# Patient Record
Sex: Female | Born: 2000 | Race: White | Hispanic: No | Marital: Single | Smoking: Former smoker
Health system: Southern US, Community
[De-identification: ages and names within clinical notes are randomized; demographics above are authoritative.]

## PROBLEM LIST (undated history)

## (undated) HISTORY — PX: NO PAST SURGERIES: SHX2092

---
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
## 2021-01-18 ENCOUNTER — Ambulatory Visit: Payer: Self-pay | Admitting: Orthopaedic Surgery

## 2021-07-05 ENCOUNTER — Encounter: Payer: Self-pay | Admitting: Emergency Medicine

## 2021-07-05 ENCOUNTER — Emergency Department
Admission: EM | Admit: 2021-07-05 | Discharge: 2021-07-05 | Discharge status: Absent without leave | Payer: 59 | Source: Home / Self Care

## 2021-07-05 ENCOUNTER — Other Ambulatory Visit: Payer: Self-pay

## 2021-07-05 ENCOUNTER — Emergency Department
Admission: EM | Admit: 2021-07-05 | Discharge: 2021-07-05 | Disposition: A | Discharge status: Other Acute Inpatient Hospital | Payer: 59 | Source: Home / Self Care

## 2021-07-05 DIAGNOSIS — T50905A Adverse effect of unspecified drugs, medicaments and biological substances, initial encounter: Secondary | ICD-10-CM

## 2021-07-05 NOTE — ED Triage Notes (Signed)
Patient states that she got vaccinated for COVID on Tuesday (J&J), started having weakness on right side of body.  Arm and leg feels like jelly and SOB.  Patient received the injection from [facility].

## 2021-07-05 NOTE — Discharge Instructions (Addendum)
You are having more symptoms than we can evaluate in the urgent care.  Go to the ER

## 2021-07-16 ENCOUNTER — Ambulatory Visit (INDEPENDENT_AMBULATORY_CARE_PROVIDER_SITE_OTHER): Payer: 59 | Admitting: Physician Assistant

## 2021-07-16 ENCOUNTER — Encounter: Payer: Self-pay | Admitting: Physician Assistant

## 2021-07-16 ENCOUNTER — Other Ambulatory Visit: Payer: Self-pay

## 2021-07-16 VITALS — BP 125/81 | HR 107 | Ht 62.0 in | Wt 170.0 lb

## 2021-07-16 DIAGNOSIS — F419 Anxiety disorder, unspecified: Secondary | ICD-10-CM

## 2021-07-16 DIAGNOSIS — R7301 Impaired fasting glucose: Secondary | ICD-10-CM | POA: Diagnosis not present

## 2021-07-16 DIAGNOSIS — R202 Paresthesia of skin: Secondary | ICD-10-CM

## 2021-07-16 DIAGNOSIS — T50Z95A Adverse effect of other vaccines and biological substances, initial encounter: Secondary | ICD-10-CM

## 2021-07-16 DIAGNOSIS — R200 Anesthesia of skin: Secondary | ICD-10-CM | POA: Diagnosis not present

## 2021-07-16 DIAGNOSIS — Z720 Tobacco use: Secondary | ICD-10-CM

## 2021-07-16 DIAGNOSIS — R000 Tachycardia, unspecified: Secondary | ICD-10-CM | POA: Insufficient documentation

## 2021-07-16 DIAGNOSIS — Z1329 Encounter for screening for other suspected endocrine disorder: Secondary | ICD-10-CM

## 2021-07-16 NOTE — Progress Notes (Signed)
New Patient Office Visit  Subjective:  Patient ID: Jamie Krueger, female    DOB: 01/12/2001  Age: 20 y.o. MRN: [id number]  CC:  Chief Complaint  Patient presents with   Establish Care    HPI Jamie Krueger presents to establish care and discuss recent covid vaccine reaction. Pt received J and J vaccine 07/04/2021. That night she ran a fever and then next morning work up with right arm and leg tingling, weakness, heaviness. She went to [facility] and sent to ED to rule out blood clot. CBC, CMP look good. Fasting glucose was elevated. She is feeling better today. Her strength has come back and just has some tingling in right upper arm.   History reviewed. No pertinent past medical history.  Past Surgical History:  Procedure Laterality Date   NO PAST SURGERIES      Family History  Problem Relation Age of Onset   Hypertension Father    Breast cancer Maternal Grandmother     Social History   Socioeconomic History   Marital status: Single    Spouse name: Not on file   Number of children: Not on file   Years of education: Not on file   Highest education level: Not on file  Occupational History   Not on file  Tobacco Use   Smoking status: Former    Types: Cigarettes   Smokeless tobacco: Current  Substance and Sexual Activity   Alcohol use: Yes    Comment: 5 drinks per week   Drug use: Yes    Types: Marijuana    Comment: rarely   Sexual activity: Yes    Partners: Male    Birth control/protection: Condom, Pill  Other Topics Concern   Not on file  Social History Narrative   Not on file   Social Determinants of Health   Financial Resource Strain: Not on file  Food Insecurity: Not on file  Transportation Needs: Not on file  Physical Activity: Not on file  Stress: Not on file  Social Connections: Not on file  Intimate Partner Violence: Not on file    ROS Review of Systems See HPI.  Objective:   Today's Vitals: BP 125/81   Pulse (!) 107   Ht 5' 2"  (1.575 m)   Wt 170  lb (77.1 kg)   LMP 06/20/2021   SpO2 98%   BMI 31.09 kg/m   Physical Exam Vitals reviewed.  Constitutional:      Appearance: Normal appearance. She is obese.  HENT:     Head: Normocephalic.     Right Ear: Tympanic membrane normal.     Left Ear: Tympanic membrane normal.     Nose: Nose normal.     Mouth/Throat:     Mouth: Mucous membranes are moist.  Neck:     Vascular: No carotid bruit.  Cardiovascular:     Rate and Rhythm: Normal rate and regular rhythm.     Pulses: Normal pulses.     Heart sounds: Normal heart sounds.  Pulmonary:     Effort: Pulmonary effort is normal.  Musculoskeletal:        General: No swelling, tenderness, deformity or signs of injury.     Right lower leg: No edema.     Left lower leg: No edema.     Comments: 5/5 strength of upper and lower extremity.   Neurological:     General: No focal deficit present.     Mental Status: She is alert and oriented to person, place, and time.  Psychiatric:     Comments: anxious   .Marland Kitchen Depression screen [facility] 2/9 07/16/2021  Decreased Interest 0  Down, Depressed, Hopeless 1  PHQ - 2 Score 1  Altered sleeping 0  Tired, decreased energy 1  Change in appetite 2  Feeling bad or failure about yourself  1  Trouble concentrating 0  Moving slowly or fidgety/restless 0  Suicidal thoughts 0  PHQ-9 Score 5  Difficult doing work/chores Not difficult at all   .Marland Kitchen GAD 7 : Generalized Anxiety Score 07/16/2021  Nervous, Anxious, on Edge 3  Control/stop worrying 3  Worry too much - different things 3  Trouble relaxing 2  Restless 0  Easily annoyed or irritable 2  Afraid - awful might happen 2  Total GAD 7 Score 15  Anxiety Difficulty Somewhat difficult     Assessment & Plan:  Marland KitchenMarland KitchenAppollonia was seen today for establish care.  Diagnoses and all orders for this visit:  Adverse effect of vaccine, initial encounter -     Sed Rate (ESR)  Vapes nicotine containing substance  Elevated fasting glucose -     Hemoglobin  A1c  Numbness and tingling of right arm -     Sed Rate (ESR) -     TSH  Thyroid disorder screen -     TSH  Tachycardia -     TSH  Anxiety  Pt seems to be improving from vaccine reaction. Concern with elevated serum glucose in hospital due to patient reporting she was fasting.  Will recheck A1C, TSH, ESR today.  Discussed increased HR and likely due to vaping/nicotine and/or anxiety. Pt does not want to work on cessation right now with medication.  Follow up if would like to discuss anxiety again. GAD-7 was 15. Perhaps anxiety medication could help you transition off vaping. If tingling not going a way could do some EMG to look for nerve damage. Consider gabapentin or SSNRI for ongoing tingling.   Reminder patient needs pap smear.    Follow-up: Return in about 1 year (around 07/16/2022).   Iran Planas, PA-C

## 2021-07-17 LAB — HEMOGLOBIN A1C
Hgb A1c MFr Bld: 5 % of total Hgb (ref <5.7)
Mean Plasma Glucose: 97 mg/dL
eAG (mmol/L): 5.4 mmol/L

## 2021-07-17 LAB — SEDIMENTATION RATE: Sed Rate: 2 mm/h (ref 0–20)

## 2021-07-17 LAB — TSH: TSH: 0.69 mIU/L

## 2021-07-17 NOTE — Progress Notes (Signed)
Jamie Krueger,   A1C is great at 5.0 no signs of diabetes. Mean sugar of 97. TSH in normal range. Normal inflammation rate.

## 2021-11-23 ENCOUNTER — Encounter: Payer: Self-pay | Admitting: Physician Assistant

## 2021-11-23 ENCOUNTER — Telehealth (INDEPENDENT_AMBULATORY_CARE_PROVIDER_SITE_OTHER): Payer: 59 | Admitting: Physician Assistant

## 2021-11-23 DIAGNOSIS — R4589 Other symptoms and signs involving emotional state: Secondary | ICD-10-CM

## 2021-11-23 DIAGNOSIS — F951 Chronic motor or vocal tic disorder: Secondary | ICD-10-CM

## 2021-11-23 DIAGNOSIS — F411 Generalized anxiety disorder: Secondary | ICD-10-CM

## 2021-11-23 DIAGNOSIS — R454 Irritability and anger: Secondary | ICD-10-CM

## 2021-11-23 DIAGNOSIS — Z3041 Encounter for surveillance of contraceptive pills: Secondary | ICD-10-CM

## 2021-11-23 DIAGNOSIS — M7918 Myalgia, other site: Secondary | ICD-10-CM | POA: Insufficient documentation

## 2021-11-23 MED ORDER — NECON 0.5/35 (28) 0.5-35 MG-MCG PO TABS: ORAL_TABLET | ORAL | 3 refills | Status: AC

## 2021-11-23 MED ORDER — DULOXETINE HCL 30 MG PO CPEP: 30.0000 mg | ORAL_CAPSULE | Freq: Every day | ORAL | 1 refills | Status: DC

## 2021-11-23 NOTE — Progress Notes (Signed)
..Virtual Visit via Video Note  I connected with Jamie Krueger on 11/23/21 at  8:10 AM EST by a video enabled telemedicine application and verified that I am speaking with the correct person using two identifiers.  Location: Patient: home Provider: clinic  .Marland KitchenParticipating in visit:  Patient: Jamie Krueger Provider: Tandy Gaw PA-C   I discussed the limitations of evaluation and management by telemedicine and the availability of in person appointments. The patient expressed understanding and agreed to proceed.  History of Present Illness: Pt is a 20 yo female who presents to the clinic to discuss mood and pain.   Pt went off prozac about 1 year ago and since then her mood has continued to go down hill. She has gained 20lbs and just does not feel good. She is irritable. She is anxious all the time. She worries about everything. She feels achy all over her body. Her mother has fibromyalgia. She would like to go on medication. No SI/HC.   She did stop vaping 2 months.   Pt does need OCP refilled. Never had pap.   .. 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   Numbness and tingling of right arm 07/16/2021   GAD (generalized anxiety disorder) 11/23/2021   Chronic motor tic disorder 11/23/2021   Depressed mood 11/23/2021   Myofascial pain 11/23/2021   Irritable 11/23/2021   Resolved Ambulatory Problems    Diagnosis Date Noted   No Resolved Ambulatory Problems   No Additional Past Medical History      Observations/Objective: No acute distress Normal mood and appearance .Marland Kitchen Depression screen [facility] 2/9 11/23/2021 07/16/2021  Decreased Interest 1 0  Down, Depressed, Hopeless 0 1  PHQ - 2 Score 1 1  Altered sleeping 0 0  Tired, decreased  energy 1 1  Change in appetite 2 2  Feeling bad or failure about yourself  1 1  Trouble concentrating 1 0  Moving slowly or fidgety/restless 0 0  Suicidal thoughts 0 0  PHQ-9 Score 6 5  Difficult doing work/chores Somewhat difficult Not difficult at all   .Marland Kitchen GAD 7 : Generalized Anxiety Score 11/23/2021 07/16/2021  Nervous, Anxious, on Edge 3 3  Control/stop worrying 3 3  Worry too much - different things 3 3  Trouble relaxing 3 2  Restless 2 0  Easily annoyed or irritable 3 2  Afraid - awful might happen 3 2  Total GAD 7 Score 20 15  Anxiety Difficulty Extremely difficult Somewhat difficult     Assessment and Plan: Marland KitchenMarland KitchenIdalys was seen today for mental health problem.  Diagnoses and all orders for this visit:  GAD (generalized anxiety disorder) -     DULoxetine (CYMBALTA) 30 MG capsule; Take 1 capsule (30 mg total) by mouth daily.  Chronic motor tic disorder  Depressed mood -     DULoxetine (CYMBALTA) 30 MG capsule; Take 1 capsule (30 mg total) by mouth daily.  Irritable -     DULoxetine (CYMBALTA) 30 MG capsule; Take 1 capsule (30 mg total) by mouth daily.  Myofascial pain -     DULoxetine (CYMBALTA) 30 MG capsule; Take 1 capsule (30 mg total) by mouth daily.  Encounter for surveillance of contraceptive pills -     norethindrone-ethinyl estradiol (NECON 0.5/35, 28,) 0.5-35 MG-MCG tablet;  TK 1 T PO QD  Congrats on stopping vaping  PHQ stable. GAD not to goal. Concern for fibromyalgia Start cymbalta to help with mood and pain.  Discussed side effects and how to take cymbalta. Follow up in 6-8 weeks.   Needs pap at 20.  Refilled OCP Discussed STD prevention   Follow Up Instructions:    I discussed the assessment and treatment plan with the patient. The patient was provided an opportunity to ask questions and all were answered. The patient agreed with the plan and demonstrated an understanding of the instructions.   The patient was advised to call back or seek an  in-person evaluation if the symptoms worsen or if the condition fails to improve as anticipated.    Tandy Gaw, PA-C

## 2021-12-26 ENCOUNTER — Encounter: Payer: Self-pay | Admitting: Physician Assistant

## 2021-12-26 ENCOUNTER — Telehealth: Payer: 59 | Admitting: Physician Assistant

## 2021-12-26 VITALS — Temp 98.1°F | Ht 62.0 in | Wt 168.0 lb

## 2021-12-26 DIAGNOSIS — F410 Panic disorder [episodic paroxysmal anxiety] without agoraphobia: Secondary | ICD-10-CM | POA: Diagnosis not present

## 2021-12-26 DIAGNOSIS — F411 Generalized anxiety disorder: Secondary | ICD-10-CM

## 2021-12-26 DIAGNOSIS — R4589 Other symptoms and signs involving emotional state: Secondary | ICD-10-CM

## 2021-12-26 DIAGNOSIS — M7918 Myalgia, other site: Secondary | ICD-10-CM | POA: Diagnosis not present

## 2021-12-26 MED ORDER — DULOXETINE HCL 60 MG PO CPEP: 60.0000 mg | ORAL_CAPSULE | Freq: Every day | ORAL | 1 refills | Status: DC

## 2021-12-26 MED ORDER — HYDROXYZINE HCL 10 MG PO TABS: 10.0000 mg | ORAL_TABLET | Freq: Three times a day (TID) | ORAL | 1 refills | Status: AC | PRN

## 2021-12-26 MED ORDER — HYDROXYZINE HCL 10 MG PO TABS
10.0000 mg | ORAL_TABLET | Freq: Three times a day (TID) | ORAL | 1 refills | Status: DC | PRN
Start: 2021-12-26 — End: 2021-12-26

## 2021-12-26 NOTE — Addendum Note (Signed)
Addended bySilvio Pate on: 12/26/2021 10:45 AM   Modules accepted: Orders

## 2021-12-26 NOTE — Progress Notes (Signed)
..Virtual Visit via Video Note  I connected with Jamie Krueger on 12/26/21 at  9:30 AM EST by a video enabled telemedicine application and verified that I am speaking with the correct person using two identifiers.  Location: Patient: home Provider: clinic  .Marland KitchenParticipating in visit:  Patient: Jamie Krueger Provider: Tandy Gaw PA-C   I discussed the limitations of evaluation and management by telemedicine and the availability of in person appointments. The patient expressed understanding and agreed to proceed.  History of Present Illness: Pt is a 21 yo female who needs refill on medications and to follow up on GAD, Depressed mood, myofascial pain.   She is doing much better. Her anxiety has improved overall but had a few little more severe panic attack where her chest hurt. Depressed mood is much better. Her overall pain is 60 percent better. No SI/HC. She is very happy with medication. Denies any side effects.   .. 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   Numbness and tingling of right arm 07/16/2021   GAD (generalized anxiety disorder) 11/23/2021   Chronic motor tic disorder 11/23/2021   Depressed mood 11/23/2021   Myofascial pain 11/23/2021   Irritable 11/23/2021   Panic attacks 12/26/2021   Resolved Ambulatory Problems    Diagnosis Date Noted   No Resolved Ambulatory Problems   No Additional Past Medical History    Observations/Objective: No acute distress Normal mood and appearance Normal breathing  .Marland Kitchen Today's Vitals   12/26/21 0927  Temp: 98.1 F (36.7 C)  Weight: 168 lb (76.2 kg)  Height: 5\' 2"  (1.575 m)   Body mass index is 30.73 kg/m.   .. Depression screen [facility] 2/9 12/26/2021 11/23/2021 07/16/2021  Decreased  Interest 0 1 0  Down, Depressed, Hopeless 0 0 1  PHQ - 2 Score 0 1 1  Altered sleeping 0 0 0  Tired, decreased energy 1 1 1   Change in appetite 0 2 2  Feeling bad or failure about yourself  0 1 1  Trouble concentrating 0 1 0  Moving slowly or fidgety/restless 0 0 0  Suicidal thoughts 0 0 0  PHQ-9 Score 1 6 5   Difficult doing work/chores Not difficult at all Somewhat difficult Not difficult at all   .07/18/2021 GAD 7 : Generalized Anxiety Score 12/26/2021 11/23/2021 07/16/2021  Nervous, Anxious, on Edge 1 3 3   Control/stop worrying 1 3 3   Worry too much - different things 1 3 3   Trouble relaxing 1 3 2   Restless 0 2 0  Easily annoyed or irritable 1 3 2   Afraid - awful might happen 1 3 2   Total GAD 7 Score 6 20 15   Anxiety Difficulty Somewhat difficult Extremely difficult Somewhat difficult     Assessment and Plan: 4/1/202301/01/2023Ayodele was seen today for medication management.  Diagnoses and all orders for this visit:  GAD (generalized anxiety disorder) -     DULoxetine (CYMBALTA) 60 MG capsule; Take 1 capsule (60 mg total) by mouth daily.  Depressed mood -     DULoxetine (CYMBALTA) 60 MG capsule; Take 1 capsule (60 mg total) by mouth daily.  Myofascial pain -     DULoxetine (CYMBALTA) 60 MG capsule; Take 1 capsule (60 mg total) by mouth daily.  Panic attacks -  hydrOXYzine (ATARAX) 10 MG tablet; Take 1 tablet (10 mg total) by mouth 3 (three) times daily as needed. For anxiety and panic attacks   PHQ and GAD numbers improved.  Pt still having some panic attacks Increased cymbalta to 60mg  and added hydroxyzine for as needed anxiety/panic attacks. Discussed importance of breathing during panic attacks.  Pain is much better.  Follow up in 6 months or as needed.    Follow Up Instructions:    I discussed the assessment and treatment plan with the patient. The patient was provided an opportunity to ask questions and all were answered. The patient agreed with the plan and demonstrated an  understanding of the instructions.   The patient was advised to call back or seek an in-person evaluation if the symptoms worsen or if the condition fails to improve as anticipated.    , PA-C

## 2021-12-26 NOTE — Progress Notes (Signed)
Pt would like to follow up cymbalta overall pain and anxiety  has gone  down pt needs rx fill

## 2022-01-02 ENCOUNTER — Encounter: Payer: Self-pay | Admitting: Physician Assistant

## 2022-01-02 DIAGNOSIS — R4589 Other symptoms and signs involving emotional state: Secondary | ICD-10-CM

## 2022-01-02 DIAGNOSIS — F411 Generalized anxiety disorder: Secondary | ICD-10-CM

## 2022-01-02 DIAGNOSIS — M7918 Myalgia, other site: Secondary | ICD-10-CM

## 2022-01-04 MED ORDER — DULOXETINE HCL 60 MG PO CPEP: 60.0000 mg | ORAL_CAPSULE | Freq: Every day | ORAL | 1 refills | Status: DC

## 2022-01-04 MED ORDER — DULOXETINE HCL 60 MG PO CPEP: 60.0000 mg | ORAL_CAPSULE | Freq: Every day | ORAL | 1 refills | Status: AC

## 2022-01-30 ENCOUNTER — Telehealth: Payer: 59 | Admitting: Physician Assistant

## 2022-01-30 ENCOUNTER — Encounter: Payer: Self-pay | Admitting: Physician Assistant

## 2022-01-30 ENCOUNTER — Other Ambulatory Visit: Payer: Self-pay

## 2022-01-30 ENCOUNTER — Other Ambulatory Visit: Payer: Self-pay | Admitting: Physician Assistant

## 2022-01-30 ENCOUNTER — Ambulatory Visit (INDEPENDENT_AMBULATORY_CARE_PROVIDER_SITE_OTHER): Payer: 59

## 2022-01-30 DIAGNOSIS — R1031 Right lower quadrant pain: Secondary | ICD-10-CM | POA: Diagnosis not present

## 2022-01-30 DIAGNOSIS — R102 Pelvic and perineal pain unspecified side: Secondary | ICD-10-CM

## 2022-01-30 DIAGNOSIS — R8290 Unspecified abnormal findings in urine: Secondary | ICD-10-CM | POA: Diagnosis not present

## 2022-01-30 MED ORDER — IOHEXOL 300 MG/ML  SOLN
100.0000 mL | Freq: Once | INTRAMUSCULAR | Status: AC | PRN
  Administered 2022-01-30: 100 mL via INTRAVENOUS

## 2022-01-30 NOTE — Progress Notes (Signed)
..Virtual Visit via Video Note ? ?I connected with Jamie Krueger on 01/30/22 at  7:50 AM EST by a video enabled telemedicine application and verified that I am speaking with the correct person using two identifiers. ? ?Location: ?Patient: home ?Provider: clinic ? ?Marland Kitchen.Participating in visit:  ?Patient: Jamie Krueger ?Provider: Tandy Gaw PA-C ?Provider in training: Colbert Coyer PA-S ?  ?I discussed the limitations of evaluation and management by telemedicine and the availability of in person appointments. The patient expressed understanding and agreed to proceed. ? ?History of Present Illness: ?Pt is a 21 yo female with right lower quadrant pain and pelvic and right lower quadrant pain for 4 days. She went to UC 3/6. UA showed large blood and elevated urobilogen. STD panel was ordered. She was treated for PID. She has been on cipro and doxy with no benefit. She is running 99.1 temperature. Mixed hard and soft stools. No hx of hemorrhoids. Last BM 20 minutes ago. Hx of bloody stool about 1 month ago but did not have followed up. Last week she felt like she had flu.  ? ?.. ?Active Ambulatory Problems  ?  Diagnosis Date Noted  ? ASTHMA 01/17/2010  ? Immunization reaction 07/16/2021  ? Vapes nicotine containing substance 07/16/2021  ? Elevated fasting glucose 07/16/2021  ? Tachycardia 07/16/2021  ? Anxiety 07/16/2021  ? 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 Numbness and tingling of right arm 07/16/2021  ? GAD (generalized anxiety disorder) 11/23/2021  ? Chronic motor tic disorder 11/23/2021  ? Depressed mood 11/23/2021  ? Myofascial pain 11/23/2021  ? Irritable 11/23/2021  ? Panic attacks 12/26/2021  ? Pelvic pain 01/30/2022  ? Right lower quadrant pain 01/30/2022  ? Abnormal urine findings 01/30/2022  ? ?Resolved Ambulatory Problems  ?  Diagnosis Date Noted  ? No Resolved Ambulatory Problems  ? ?No Additional Past Medical History  ? ? ?Observations/Objective: ?No acute distress ?Normal mood and appearance ?No labored  breathing ? ?Assessment and Plan: ?..Jamie Krueger was seen today for pelvic pain. ? ?Diagnoses and all orders for this visit: ? ?Pelvic pain ?-     US Abdomen Complete; Future ?-     US Pelvic Complete With Transvaginal; Future ?-     Urinalysis, Routine w reflex microscopic ?-     CBC w/Diff/Platelet ?-     COMPLETE METABOLIC PANEL WITH GFR ? ?Right lower quadrant pain ?-     US Abdomen Complete; Future ?-     US Pelvic Complete With Transvaginal; Future ?-     Urinalysis, Routine w reflex microscopic ?-     CBC w/Diff/Platelet ?-     COMPLETE METABOLIC PANEL WITH GFR ? ?Abnormal urine findings ?-     US Abdomen Complete; Future ?-     US Pelvic Complete With Transvaginal; Future ?-     Urinalysis, Routine w reflex microscopic ?-     CBC w/Diff/Platelet ?-     COMPLETE METABOLIC PANEL WITH GFR ? ?Unclear etiology ?Will get abdominal u/s with repeat urine and CBC/CMP.  ?Continue cipro and doxycycline until STD panel come back ?Follow up as needed or with results ? ? ? ?Follow Up Instructions: ? ?  ?I discussed the assessment and treatment plan with the patient. The patient was provided an opportunity to ask questions and all were answered. The patient agreed with the plan and demonstrated an understanding of the instructions. ?  ?The patient was advised to call back or seek an in-person evaluation if the symptoms worsen or if the  condition fails to improve as anticipated. ? ?Tandy Gaw, PA-C ? ? ? ? ? ? ? ? ?

## 2022-01-30 NOTE — Progress Notes (Signed)
Abdominal/pelvic pain  ?Went to [facility]  ?Given 2 antibiotics - not any better - tested for UTI, STDs (STD not back, UA dipstick results below) ?Lower abdominal pain, to the right side, also in lower back ?Having some constipation now because it hurts to "push" but patient states no constipation prior to abdominal pain ?Some nausea, but thinks related to antibiotic ? ?Recent Results (from the past 14 hour(s))  ?POC Urine Dipstick  ?Collection Time: 01/28/22 6:00 PM  ?Result Value Ref Range  ?Color Yellow Yellow  ?Clarity Clear Clear  ?Glucose,Urine Negative (A) Negative mg/dL  ?Bilirubin Negative Negative  ?Ketones,Urine Negative Negative mg/dL  ?Specific Gravity 1.025 1.005, 1.010, 1.015, 1.020, 1.025, 1.030  ?Blood 1+ (A) Negative  ?pH 6.0 5 - 9  ?Protein Negative Negative mg/dL  ?Urobilinogen 4.0 (A) 0.2, 1.0 EU/dL  ?Nitrite Negative Negative  ?Leukocyte Esterase +/- (A) Negative  ?

## 2022-01-30 NOTE — Progress Notes (Signed)
Sent CT upon review.  ?

## 2022-01-31 ENCOUNTER — Encounter: Payer: Self-pay | Admitting: Physician Assistant

## 2022-01-31 NOTE — Progress Notes (Signed)
CT normal. No ovarian cyst. Normal gallbladder. No appendicitis. How are you feeling today?

## 2022-02-01 ENCOUNTER — Encounter: Payer: Self-pay | Admitting: Physician Assistant

## 2022-02-01 LAB — CBC WITH DIFFERENTIAL/PLATELET
Absolute Monocytes: 428 cells/uL (ref 200–950)
Basophils Absolute: 28 cells/uL (ref 0–200)
Basophils Relative: 0.6 %
Eosinophils Absolute: 0 cells/uL — ABNORMAL LOW (ref 15–500)
Eosinophils Relative: 0 %
HCT: 37.2 % (ref 35.0–45.0)
Hemoglobin: 12.1 g/dL (ref 11.7–15.5)
Lymphs Abs: 1932 cells/uL (ref 850–3900)
MCH: 26.7 pg — ABNORMAL LOW (ref 27.0–33.0)
MCHC: 32.5 g/dL (ref 32.0–36.0)
MCV: 81.9 fL (ref 80.0–100.0)
MPV: 10.1 fL (ref 7.5–12.5)
Monocytes Relative: 9.1 %
Neutro Abs: 2312 cells/uL (ref 1500–7800)
Neutrophils Relative %: 49.2 %
Platelets: 303 10*3/uL (ref 140–400)
RBC: 4.54 10*6/uL (ref 3.80–5.10)
RDW: 12.9 % (ref 11.0–15.0)
Total Lymphocyte: 41.1 %
WBC: 4.7 10*3/uL (ref 3.8–10.8)

## 2022-02-01 LAB — URINALYSIS, ROUTINE W REFLEX MICROSCOPIC
Bilirubin Urine: NEGATIVE
Glucose, UA: NEGATIVE
Hyaline Cast: NONE SEEN /LPF
Ketones, ur: NEGATIVE
Leukocytes,Ua: NEGATIVE
Nitrite: NEGATIVE
Protein, ur: NEGATIVE
Specific Gravity, Urine: 1.024 (ref 1.001–1.035)
Yeast: NONE SEEN /HPF
pH: 6 (ref 5.0–8.0)

## 2022-02-01 LAB — COMPLETE METABOLIC PANEL WITH GFR
AG Ratio: 1.5 (calc) (ref 1.0–2.5)
ALT: 14 U/L (ref 6–29)
AST: 14 U/L (ref 10–30)
Albumin: 4.2 g/dL (ref 3.6–5.1)
Alkaline phosphatase (APISO): 58 U/L (ref 31–125)
BUN: 13 mg/dL (ref 7–25)
CO2: 26 mmol/L (ref 20–32)
Calcium: 9.2 mg/dL (ref 8.6–10.2)
Chloride: 105 mmol/L (ref 98–110)
Creat: 0.52 mg/dL (ref 0.50–0.96)
Globulin: 2.8 g/dL (calc) (ref 1.9–3.7)
Glucose, Bld: 82 mg/dL (ref 65–99)
Potassium: 4.3 mmol/L (ref 3.5–5.3)
Sodium: 139 mmol/L (ref 135–146)
Total Bilirubin: 0.4 mg/dL (ref 0.2–1.2)
Total Protein: 7 g/dL (ref 6.1–8.1)
eGFR: 135 mL/min/{1.73_m2} (ref >=60)

## 2022-02-01 LAB — MICROSCOPIC MESSAGE

## 2022-02-01 NOTE — Progress Notes (Signed)
Your lab work is within acceptable range and there are no concerning findings.   ?

## 2022-02-02 NOTE — Telephone Encounter (Signed)
Already addressed I mulitple messages ?

## 2022-02-03 ENCOUNTER — Encounter: Payer: Self-pay | Admitting: Physician Assistant

## 2022-02-03 DIAGNOSIS — R102 Pelvic and perineal pain: Secondary | ICD-10-CM

## 2022-04-05 ENCOUNTER — Encounter: Payer: 59 | Admitting: Certified Nurse Midwife

## 2022-05-07 ENCOUNTER — Encounter: Payer: Self-pay | Admitting: Physician Assistant

## 2022-05-07 ENCOUNTER — Ambulatory Visit: Payer: 59 | Admitting: Physician Assistant

## 2022-05-07 VITALS — BP 133/77 | HR 77 | Ht 62.0 in | Wt 164.0 lb

## 2022-05-07 DIAGNOSIS — R634 Abnormal weight loss: Secondary | ICD-10-CM | POA: Diagnosis not present

## 2022-05-07 DIAGNOSIS — N644 Mastodynia: Secondary | ICD-10-CM

## 2022-05-07 DIAGNOSIS — Z803 Family history of malignant neoplasm of breast: Secondary | ICD-10-CM | POA: Diagnosis not present

## 2022-05-07 DIAGNOSIS — N6323 Unspecified lump in the left breast, lower outer quadrant: Secondary | ICD-10-CM | POA: Insufficient documentation

## 2022-05-07 NOTE — Patient Instructions (Signed)
Order placed for mammogram and ultrasound

## 2022-05-07 NOTE — Progress Notes (Signed)
   Acute Office Visit  Subjective:     Patient ID: Jamie Krueger, female    DOB: [birth year], 21 y.o.   MRN: [id number]  Chief Complaint  Patient presents with   Mass    HPI Patient is in today for left breast lump that she felt last night in the shower.  It is tender to touch but her breast have always been tender. She does not know if has grown since first found it last night. She has some intermittent clear bilateral nipple discharge from her nipple piercing but no change. She has lost 12lbs in last few months but she is trying. Her menstrual cycle is supposed to start next week. She admits to drinking more coffee in the mornings over the past few months to help with weight loss. Her mother had fibroadenomas of breast and maternal grandmother had breast cancer. No known BRAC testing done in mother or grandmother.   .. Family History  Problem Relation Age of Onset   Hypertension Father    Breast cancer Maternal Grandmother      ROS  See HPI.     Objective:    BP 133/77   Pulse 77   Ht 5\' 2"  (1.575 m)   Wt 164 lb (74.4 kg)   SpO2 100%   BMI 30.00 kg/m    Physical Exam Cardiovascular:     Rate and Rhythm: Normal rate.  Pulmonary:     Effort: Pulmonary effort is normal.  Chest:            Assessment & Plan:  [name] was seen today for mass.  Diagnoses and all orders for this visit:  Mass of lower outer quadrant of left breast -     MM DIAG BREAST TOMO BILATERAL; Future -     Jamie Krueger BREAST COMPLETE UNI LEFT INC AXILLA  Breast tenderness in female -     MM DIAG BREAST TOMO BILATERAL; Future -     US BREAST COMPLETE UNI LEFT INC AXILLA  Weight loss  Family history of breast cancer   Will get mammogram and ultrasound to evaluate breast mass Orders placed with [facility] [place]. Discussed limiting caffeine intake to help if adenoma or cyst.  Will follow up accordingly    [place], PA-C

## 2022-05-09 ENCOUNTER — Encounter: Payer: Self-pay | Admitting: Physician Assistant

## 2022-05-13 ENCOUNTER — Other Ambulatory Visit: Payer: Self-pay | Admitting: Physician Assistant

## 2022-05-13 DIAGNOSIS — N644 Mastodynia: Secondary | ICD-10-CM

## 2022-05-13 DIAGNOSIS — R2231 Localized swelling, mass and lump, right upper limb: Secondary | ICD-10-CM

## 2022-05-13 DIAGNOSIS — N6323 Unspecified lump in the left breast, lower outer quadrant: Secondary | ICD-10-CM

## 2022-05-13 NOTE — Telephone Encounter (Signed)
Patient has develop a lump in her right armpit. She already has [facility]. Patient wants to know if she should come in for a separate appointment for an evaluation of the lump. Pls advise, thanks.

## 2022-05-14 ENCOUNTER — Ambulatory Visit
Admission: RE | Admit: 2022-05-14 | Discharge: 2022-05-14 | Disposition: A | Discharge status: Home / Self Care | Payer: 59 | Source: Ambulatory Visit | Attending: Physician Assistant | Admitting: Physician Assistant

## 2022-05-14 ENCOUNTER — Other Ambulatory Visit: Payer: Self-pay | Admitting: Physician Assistant

## 2022-05-14 DIAGNOSIS — R2231 Localized swelling, mass and lump, right upper limb: Secondary | ICD-10-CM

## 2022-05-14 DIAGNOSIS — N644 Mastodynia: Secondary | ICD-10-CM

## 2022-05-14 DIAGNOSIS — N6323 Unspecified lump in the left breast, lower outer quadrant: Secondary | ICD-10-CM

## 2022-05-15 ENCOUNTER — Other Ambulatory Visit: Payer: Self-pay | Admitting: Physician Assistant

## 2022-05-15 ENCOUNTER — Ambulatory Visit
Admission: RE | Admit: 2022-05-15 | Discharge: 2022-05-15 | Disposition: A | Discharge status: Home / Self Care | Payer: 59 | Source: Ambulatory Visit | Attending: Physician Assistant | Admitting: Physician Assistant

## 2022-05-15 DIAGNOSIS — N6323 Unspecified lump in the left breast, lower outer quadrant: Secondary | ICD-10-CM

## 2022-06-11 ENCOUNTER — Telehealth: Payer: 59 | Admitting: Emergency Medicine

## 2022-06-11 DIAGNOSIS — J329 Chronic sinusitis, unspecified: Secondary | ICD-10-CM

## 2022-06-11 MED ORDER — DOXYCYCLINE HYCLATE 100 MG PO CAPS: 100.0000 mg | ORAL_CAPSULE | Freq: Two times a day (BID) | ORAL | 0 refills | Status: AC

## 2022-06-11 NOTE — Progress Notes (Signed)
E-Visit for Sinus Problems  We are sorry that you are not feeling well.  Here is how we plan to help!  Based on what you have shared with me it looks like you have sinusitis.  Sinusitis is inflammation and infection in the sinus cavities of the head.  Based on your presentation I believe you most likely have Acute Bacterial Sinusitis.  This is an infection caused by bacteria and is treated with antibiotics. I have prescribed Doxycycline 100mg  by mouth twice a day for 10 days. (Don't take if pregnant or breastfeeding.)You may use an oral decongestant such as Mucinex D or if you have glaucoma or high blood pressure use plain Mucinex. Saline nasal spray help and can safely be used as often as needed for congestion.  If you develop worsening sinus pain, fever or notice severe headache and vision changes, or if symptoms are not better after completion of antibiotic, please schedule an appointment with a health care provider.    Sinus infections are not as easily transmitted as other respiratory infection, however we still recommend that you avoid close contact with loved ones, especially the very young and elderly.  Remember to wash your hands thoroughly throughout the day as this is the number one way to prevent the spread of infection!  Home Care: Only take medications as instructed by your medical team. Complete the entire course of an antibiotic. Do not take these medications with alcohol. A steam or ultrasonic humidifier can help congestion.  You can place a towel over your head and breathe in the steam from hot water coming from a faucet. Avoid close contacts especially the very young and the elderly. Cover your mouth when you cough or sneeze. Always remember to wash your hands.  Get Help Right Away If: You develop worsening fever or sinus pain. You develop a severe head ache or visual changes. Your symptoms persist after you have completed your treatment plan.  Make sure you Understand these  instructions. Will watch your condition. Will get help right away if you are not doing well or get worse.  Thank you for choosing an e-visit.  Your e-visit answers were reviewed by a board certified advanced clinical practitioner to complete your personal care plan. Depending upon the condition, your plan could have included both over the counter or prescription medications.  Please review your pharmacy choice. Make sure the pharmacy is open so you can pick up prescription now. If there is a problem, you may contact your provider through and have the prescription routed to another pharmacy.  Your safety is important to [employer]. If you have drug allergies check your prescription carefully.   For the next 24 hours you can use MyChart to ask questions about today's visit, request a non-urgent call back, or ask for a work or school excuse. You will get an email in the next two days asking about your experience. I hope that your e-visit has been valuable and will speed your recovery.  Approximately 5 minutes was used in reviewing the patient's chart, questionnaire, prescribing medications, and documentation.

## 2022-06-27 ENCOUNTER — Encounter: Payer: Self-pay | Admitting: Neurology

## 2023-01-27 ENCOUNTER — Other Ambulatory Visit: Payer: Self-pay | Admitting: Physician Assistant

## 2023-01-27 DIAGNOSIS — R8290 Unspecified abnormal findings in urine: Secondary | ICD-10-CM

## 2023-01-27 DIAGNOSIS — R102 Pelvic and perineal pain: Secondary | ICD-10-CM

## 2023-01-27 DIAGNOSIS — R1031 Right lower quadrant pain: Secondary | ICD-10-CM

## 2023-03-17 ENCOUNTER — Telehealth: Payer: Self-pay | Admitting: General Practice

## 2023-03-17 NOTE — Transitions of Care (Post Inpatient/ED Visit) (Signed)
   03/17/2023  Name: Ayrabella Labombard MRN: [id number] DOB: 2000/12/17  Today's TOC FU Call Status: Today's TOC FU Call Status:: Successful TOC FU Call Competed TOC FU Call Complete Date: 03/17/23  Transition Care Management Follow-up Telephone Call Date of Discharge: 03/13/23 Discharge Facility: Other ([facility]) Name of Other ([facility]) [facility] Facility: [facility] Type of Discharge: Emergency Department Reason for ED Visit: Other: (pelvic pain) How have you been since you were released from the hospital?: Better Any questions or concerns?: No  Items Reviewed: Did you receive and understand the discharge instructions provided?: Yes Medications obtained and verified?: Yes (Medications Reviewed) Any new allergies since your discharge?: No Dietary orders reviewed?: NA Do you have support at home?: Yes  Home Care and Equipment/Supplies: Were Home Health Services Ordered?: NA Any new equipment or medical supplies ordered?: NA  Functional Questionnaire: Do you need assistance with bathing/showering or dressing?: No Do you need assistance with meal preparation?: No Do you need assistance with eating?: No Do you have difficulty maintaining continence: No Do you need assistance with getting out of bed/getting out of a chair/moving?: No Do you have difficulty managing or taking your medications?: No  Follow up appointments reviewed: PCP Follow-up appointment confirmed?: NA Specialist Hospital Follow-up appointment confirmed?: NA Do you need transportation to your follow-up appointment?: No Do you understand care options if your condition(s) worsen?: Yes-patient verbalized understanding    SIGNATURE : Modesto Charon, RN BSN

## 2024-05-17 IMAGING — MG MM BREAST LOCALIZATION CLIP
4 series · 4 of 12 positions shown · non-contrast
Comparison: Previous exam(s).

CLINICAL DATA: Status post ultrasound-guided core biopsy of a left
breast mass.

EXAM:
3D DIAGNOSTIC LEFT MAMMOGRAM POST ULTRASOUND BIOPSY

[L CC synth-2D]
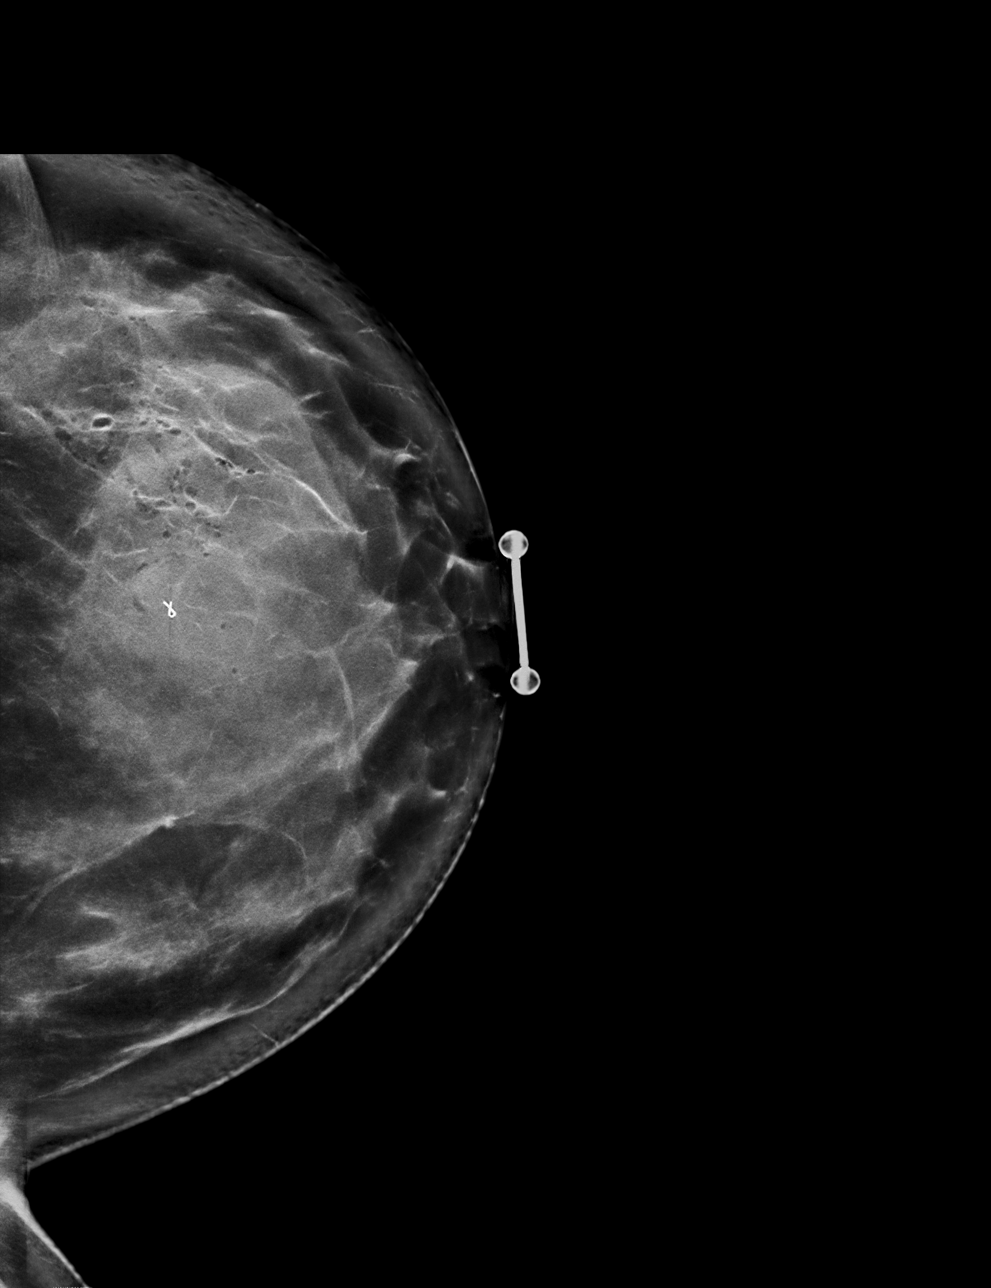

[L ML synth-2D]
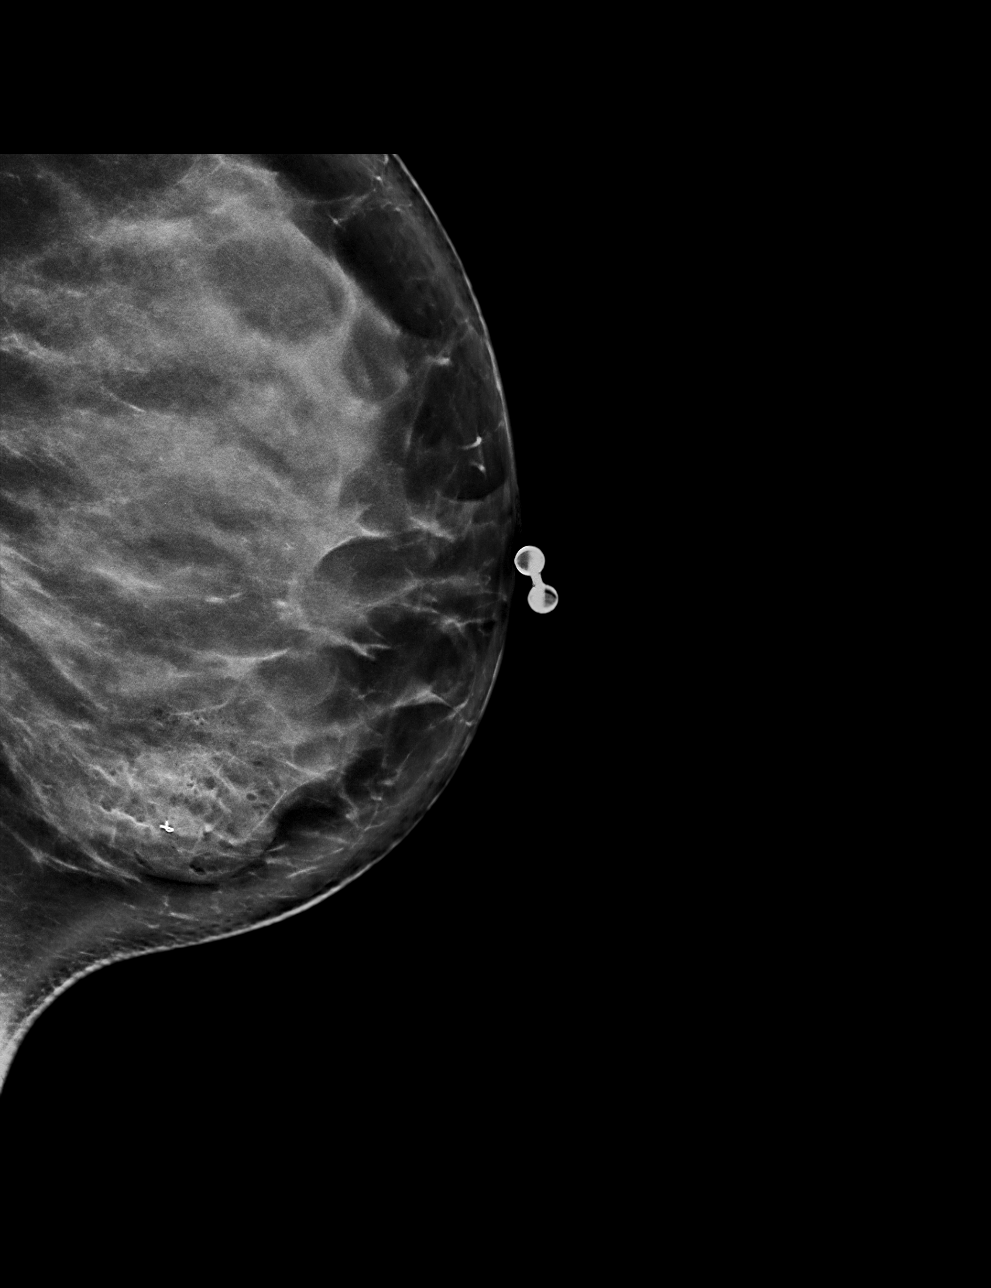

[L CC tomo · tomo slice 49/96.0]
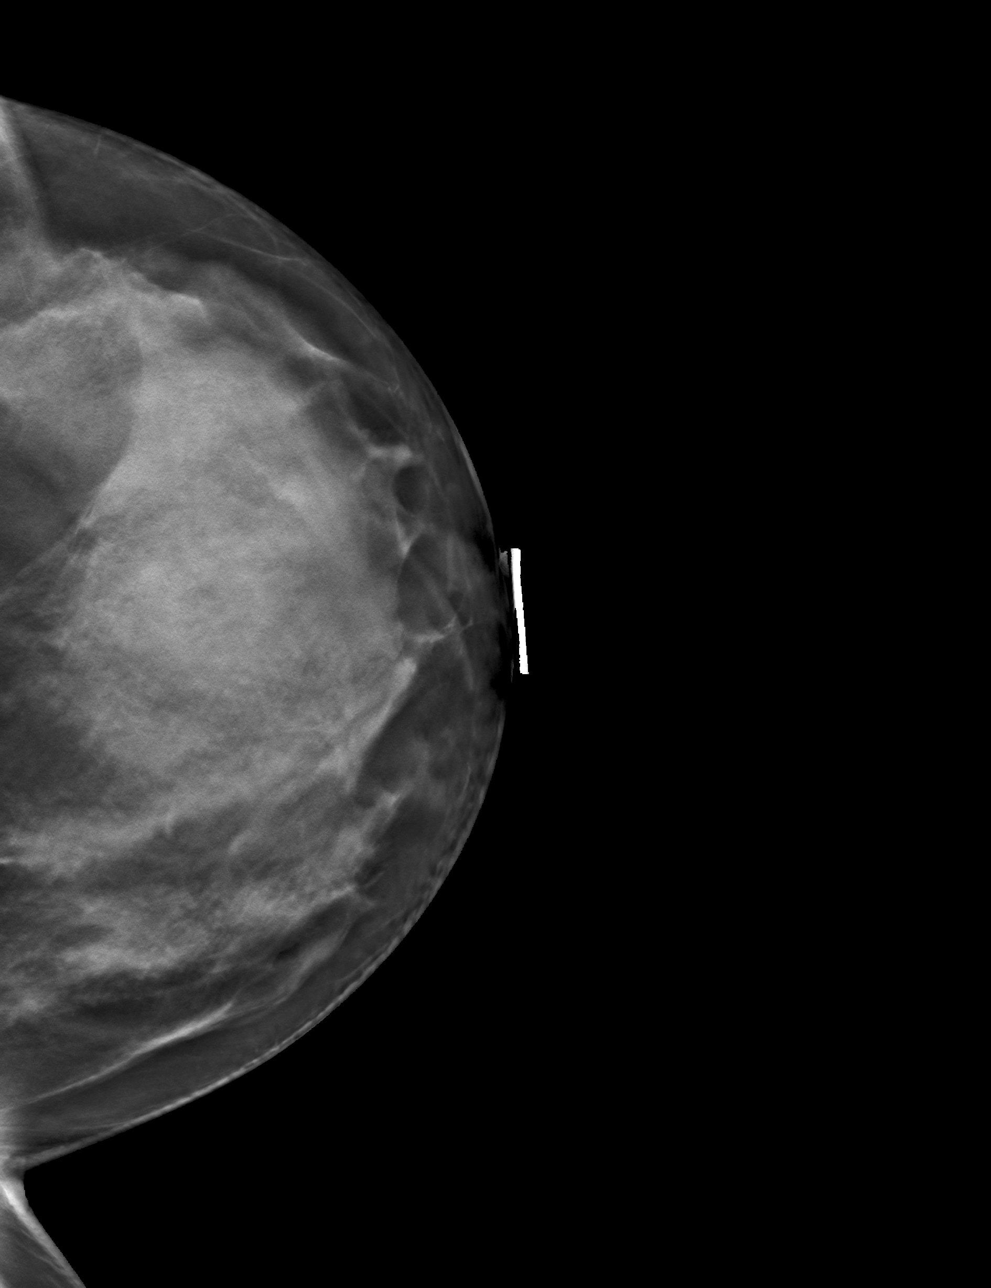

[L ML tomo · tomo slice 47/93.0]
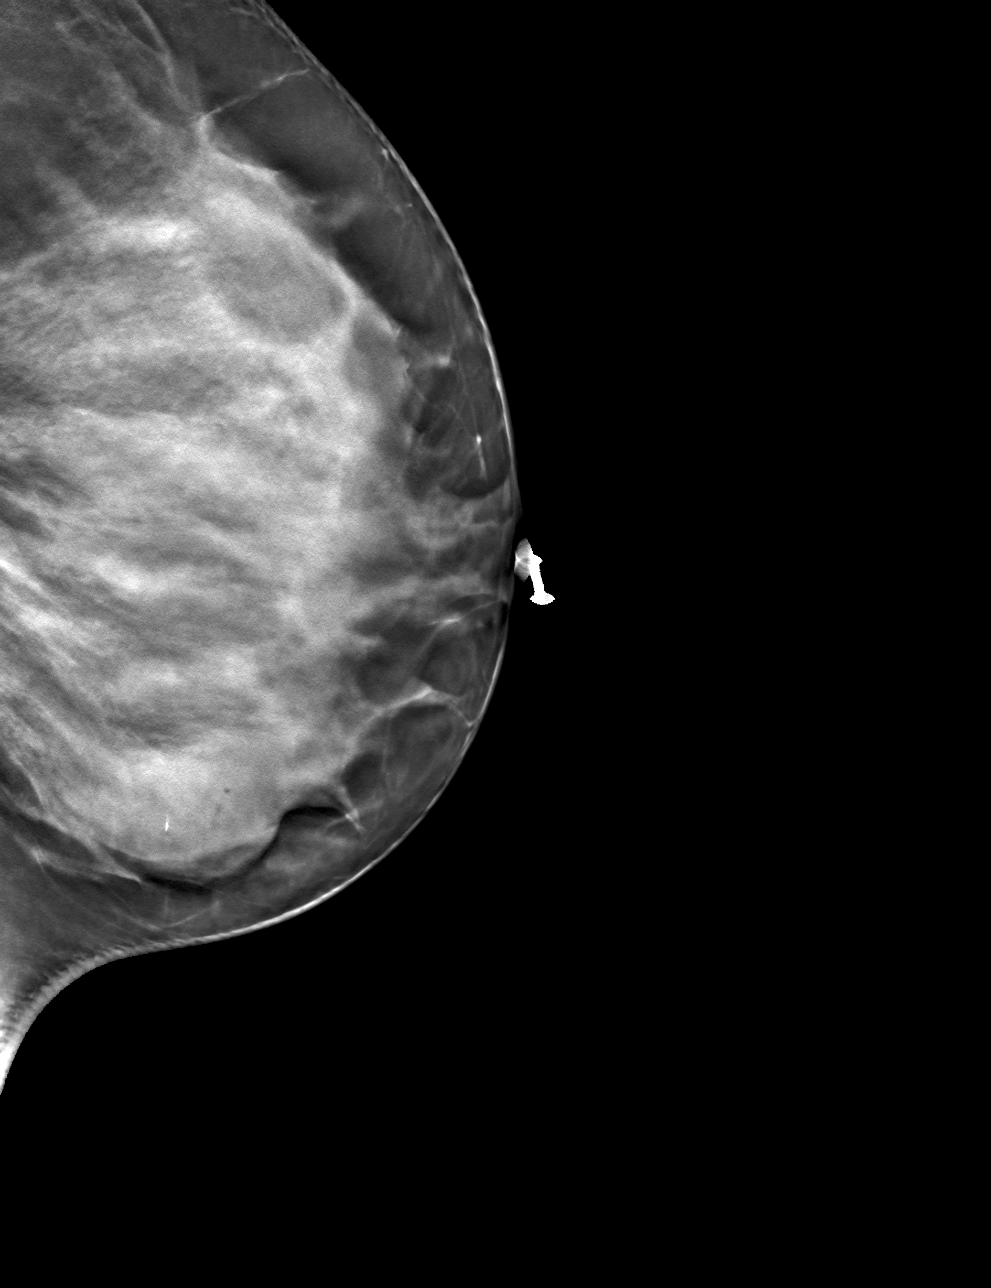

[4 of 12 positions shown; findings below may reference images not displayed]

FINDINGS: 3D Mammographic images were obtained following ultrasound guided
biopsy of the left breast. The biopsy marking clip is in expected
location in the 6 o'clock region of the left breast.
IMPRESSION: Appropriate positioning of the ribbon shaped biopsy marking clip at
the site of biopsy in the 6 o'clock region of the left breast.

Final Assessment: Post Procedure Mammograms for Marker Placement

## 2024-05-17 IMAGING — US US BREAST BX W LOC DEV 1ST LESION IMG BX SPEC US GUIDE*L*
1 series · 13 of 13 positions shown · non-contrast
Comparison: With priors.

CLINICAL DATA: Left breast mass.

EXAM:
ULTRASOUND GUIDED LEFT BREAST CORE NEEDLE BIOPSY

[Series 1: us breast bx w loc dev 1st lesion img bx spec us g · 0.06mm/px · 13 of 13 slices shown]
[im 1/13]
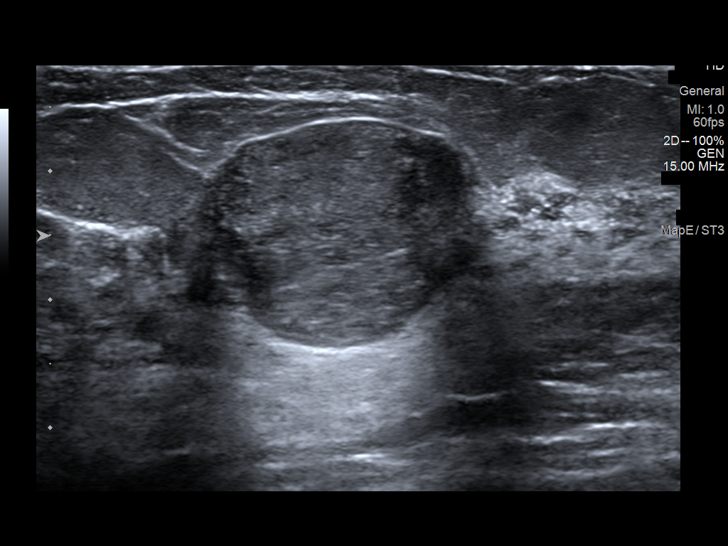
[im 2/13]
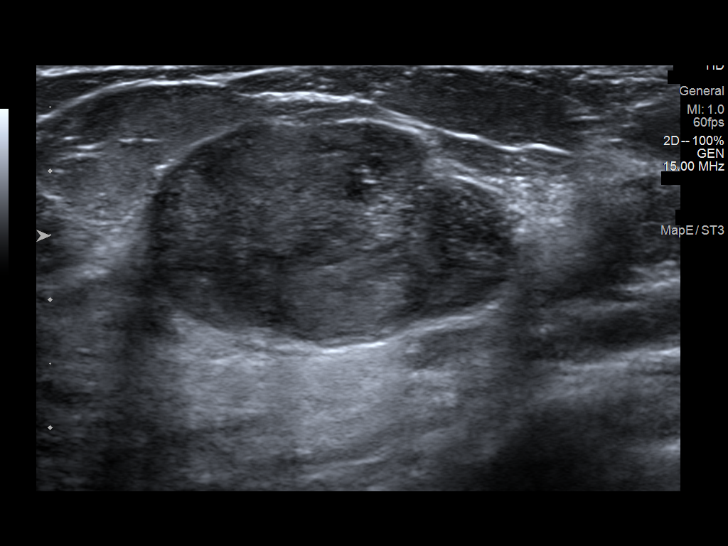
[im 3/13]
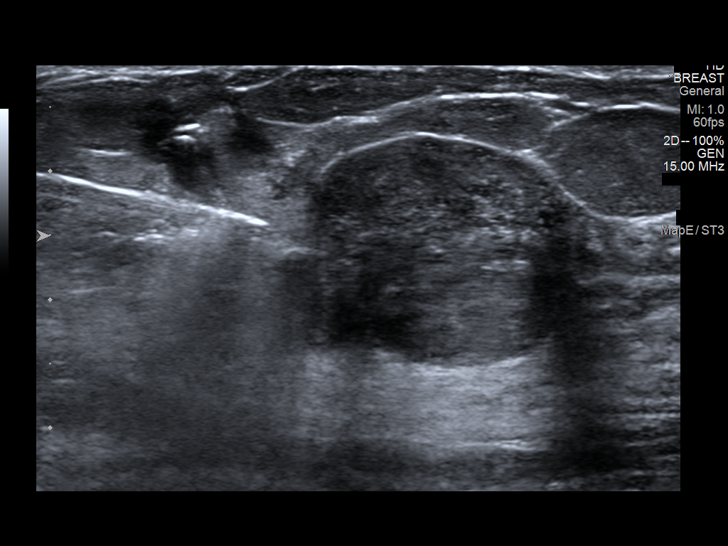
[im 4/13]
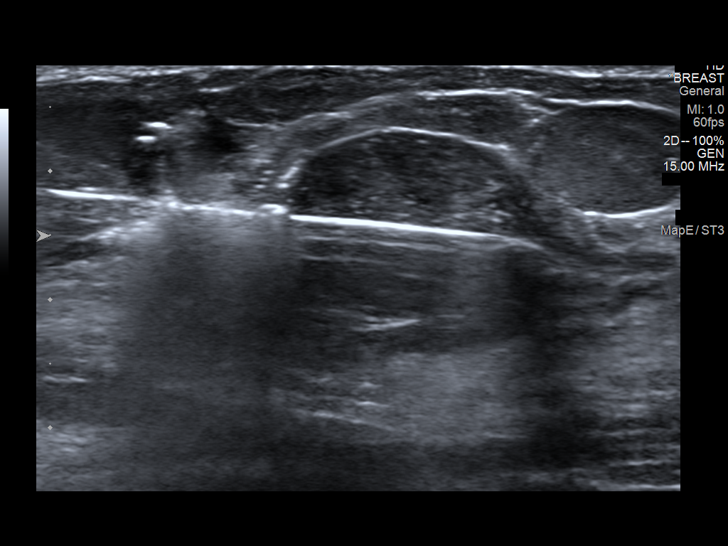
[im 5/13]
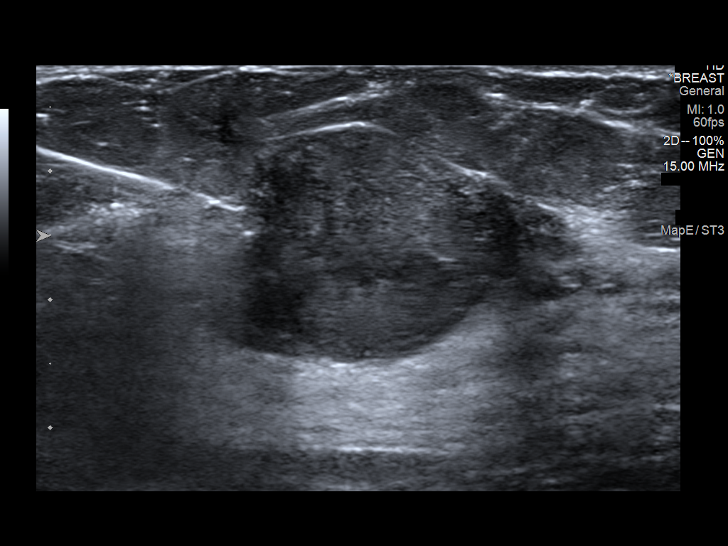
[im 6/13]
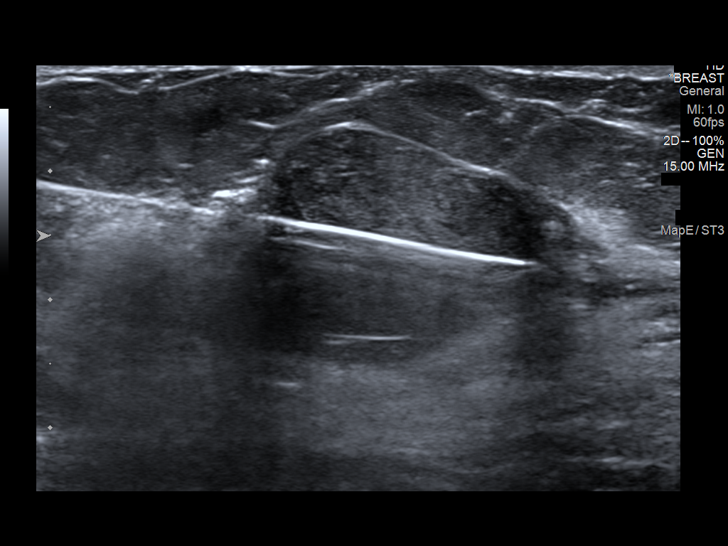
[im 7/13]
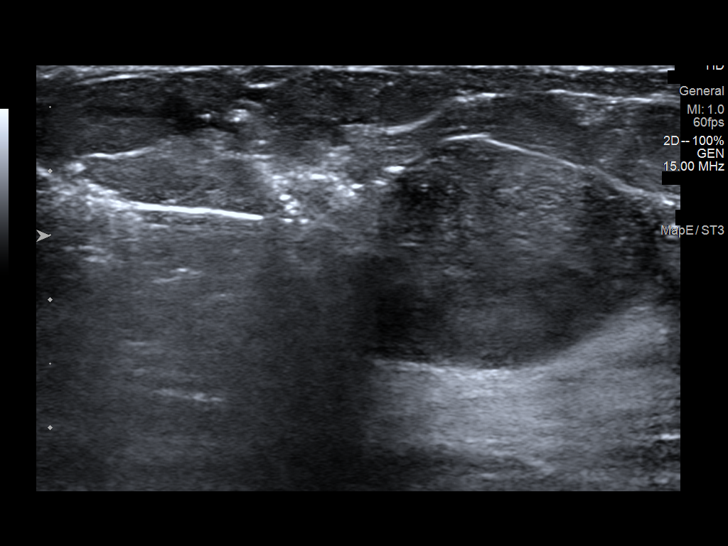
[im 8/13]
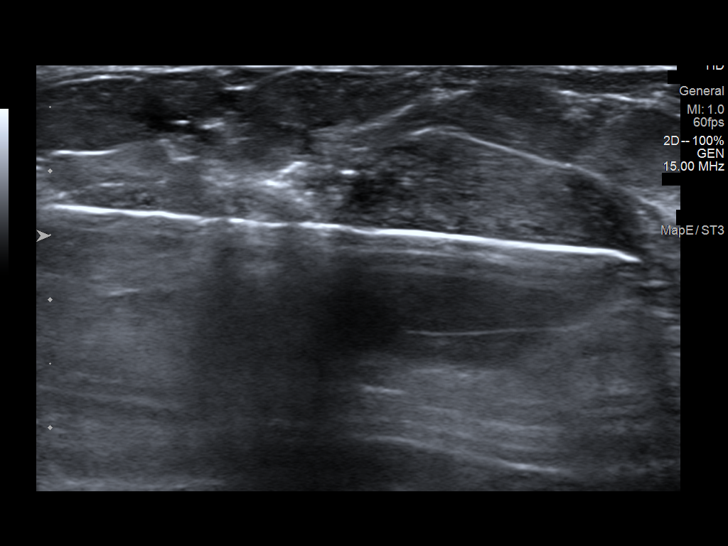
[im 9/13]
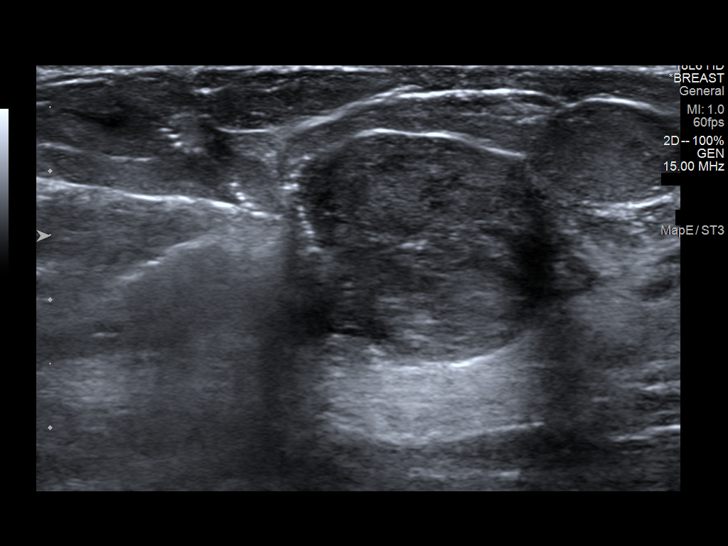
[im 10/13]
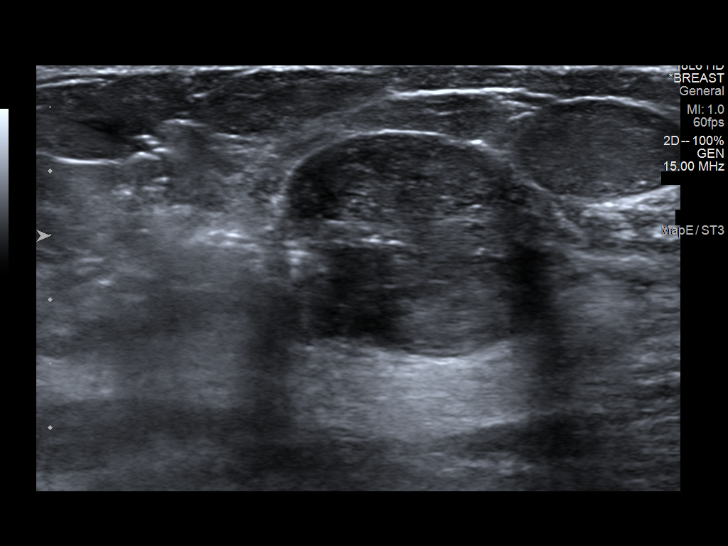
[im 11/13]
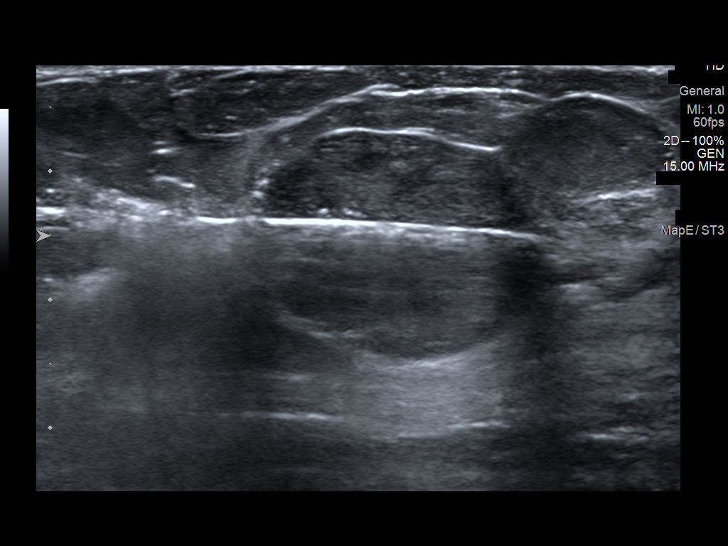
[im 12/13]
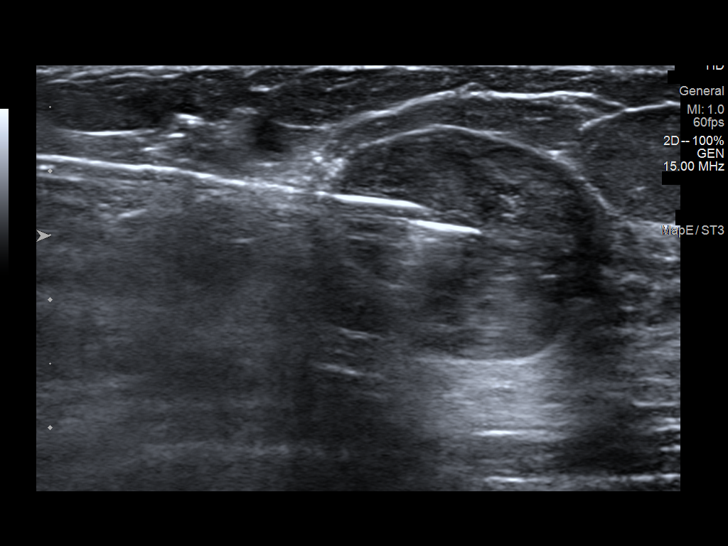
[im 13/13]
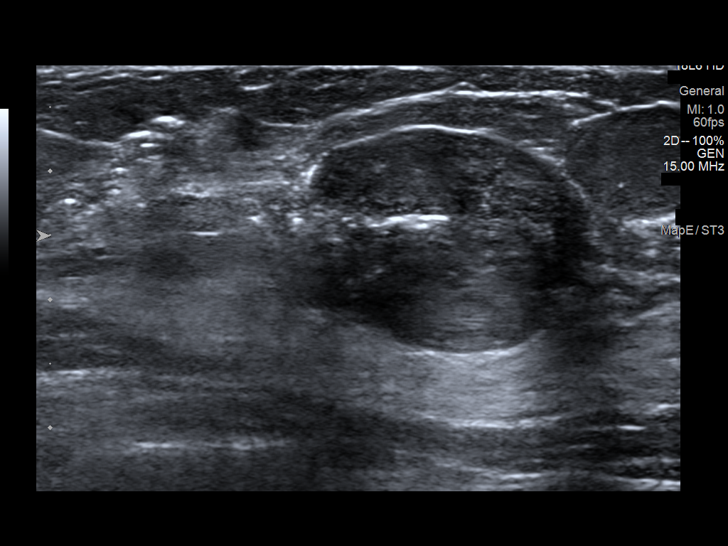

[13 of 13 positions shown; findings below may reference images not displayed]



Lesion quadrant: 6 o'clock

Using sterile technique and 1% Lidocaine as local anesthetic, under
direct ultrasound visualization, a 14 gauge Heidecke device was
used to perform biopsy of a mass in the 6 o'clock region of the left
breast using a lateral to medial approach. At the conclusion of the
procedure ribbon shaped tissue marker clip was deployed into the
biopsy cavity. Follow up 2 view mammogram was performed and dictated
separately.
IMPRESSION: Ultrasound guided biopsy of the left.  No apparent complications.
